# Patient Record
Sex: Male | Born: 1980 | Race: White | Hispanic: No | Marital: Married
Health system: Southern US, Community
[De-identification: ages and names within clinical notes are randomized; demographics above are authoritative.]

---

## 2012-02-08 ENCOUNTER — Ambulatory Visit: Payer: Self-pay | Admitting: Internal Medicine

## 2014-10-25 ENCOUNTER — Emergency Department: Payer: Self-pay | Admitting: Emergency Medicine

## 2014-12-07 ENCOUNTER — Ambulatory Visit: Payer: Self-pay | Admitting: Neurology

## 2015-09-17 ENCOUNTER — Other Ambulatory Visit: Payer: Self-pay | Admitting: Gastroenterology

## 2015-09-17 DIAGNOSIS — K5000 Crohn's disease of small intestine without complications: Secondary | ICD-10-CM

## 2015-09-25 ENCOUNTER — Ambulatory Visit
Admission: RE | Admit: 2015-09-25 | Discharge: 2015-09-25 | Disposition: A | Discharge status: Home / Self Care | Payer: BC Managed Care – PPO | Source: Ambulatory Visit | Attending: Gastroenterology | Admitting: Gastroenterology

## 2015-09-25 DIAGNOSIS — K5000 Crohn's disease of small intestine without complications: Secondary | ICD-10-CM | POA: Insufficient documentation

## 2015-09-25 MED ORDER — IOHEXOL 300 MG/ML  SOLN
100.0000 mL | Freq: Once | INTRAMUSCULAR | Status: AC | PRN
  Administered 2015-09-25: 100 mL via INTRAVENOUS

## 2017-08-09 IMAGING — CT CT ABD-PELV W/ CM
2 of 4 series · 15 of 46 positions shown, 17 images · IV contrast (omnipaque)
Comparison: None.

CLINICAL DATA: Followup active Crohn's disease.

EXAM:
CT ABDOMEN AND PELVIS WITH CONTRAST
TECHNIQUE: Multidetector CT imaging of the abdomen and pelvis was performed
using the standard protocol following bolus administration of
intravenous contrast.
CONTRAST:  100mL OMNIPAQUE IOHEXOL 300 MG/ML  SOLN

[Series 2: routine with · axial · 0.68mm/px · z∈[-839,-419]mm · 12 of 93 slices shown, 14 images]
[im 5/93  soft-tissue]
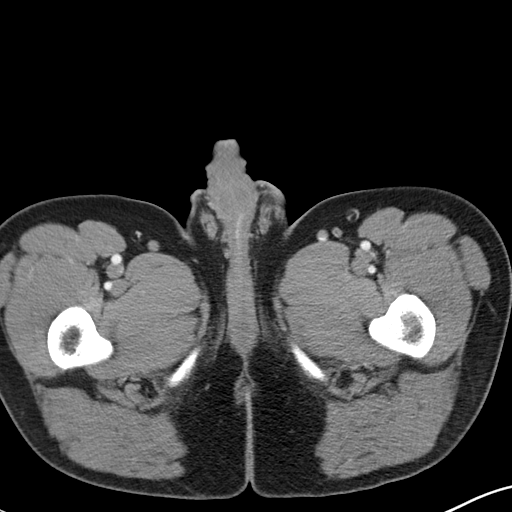
[im 5/93  bone]
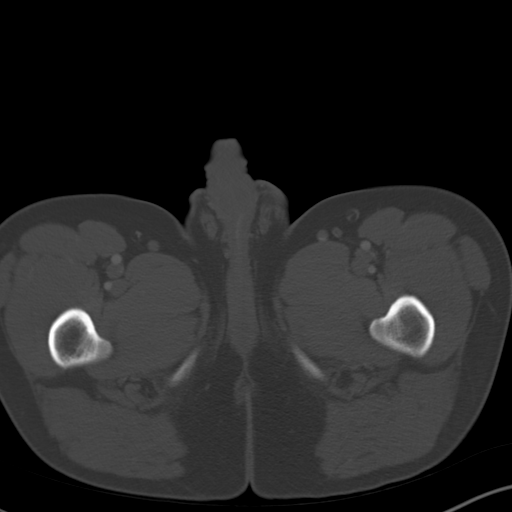
[im 13/93  soft-tissue]
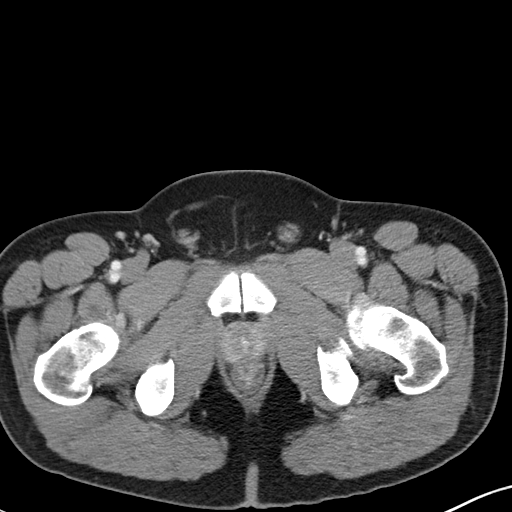
[im 21/93  soft-tissue]
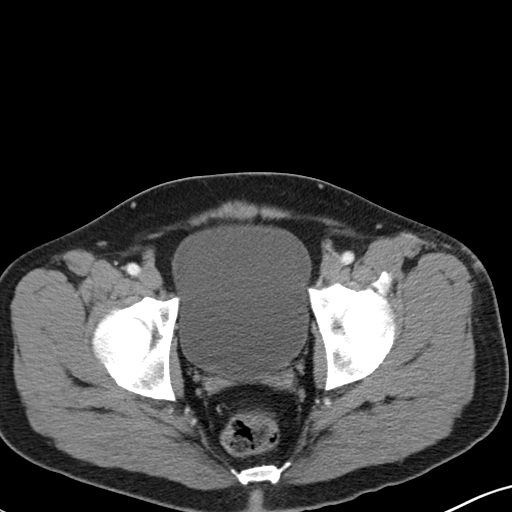
[im 29/93  soft-tissue]
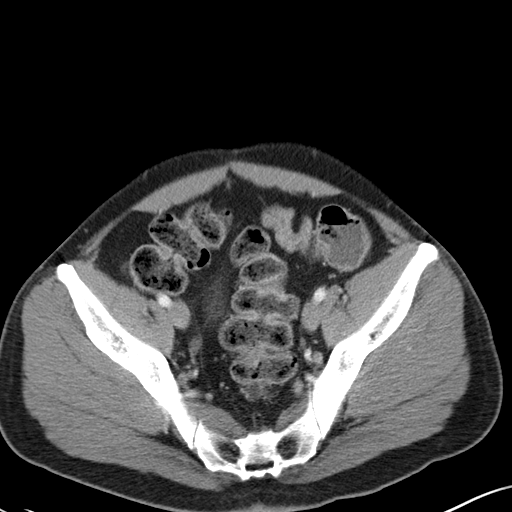
[im 37/93  soft-tissue]
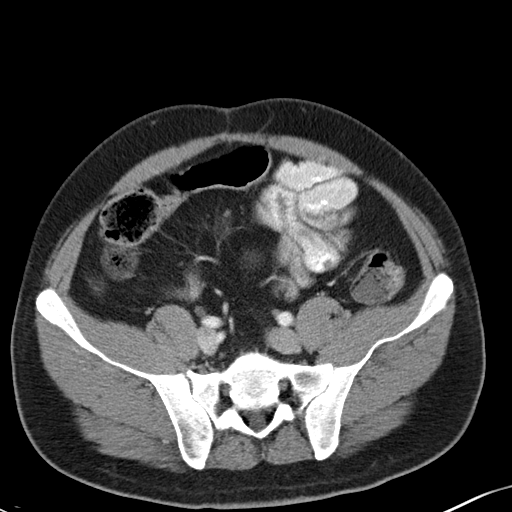
[im 45/93  soft-tissue]
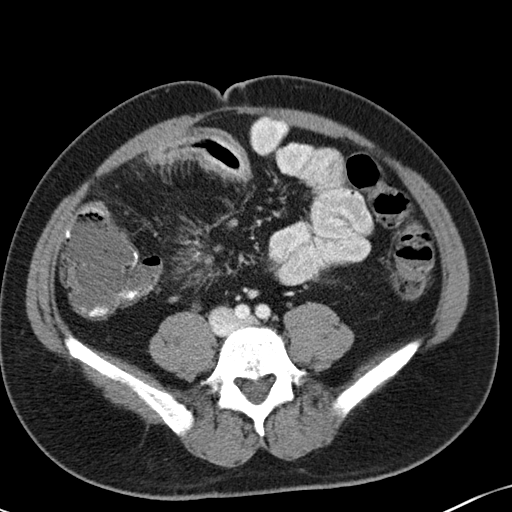
[im 49/93  soft-tissue]
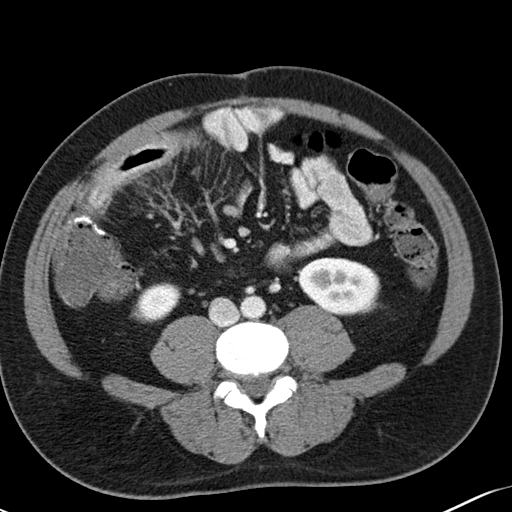
[im 57/93  soft-tissue]
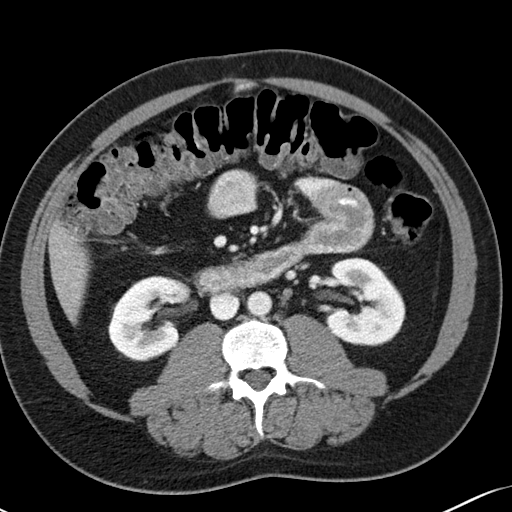
[im 65/93  soft-tissue]
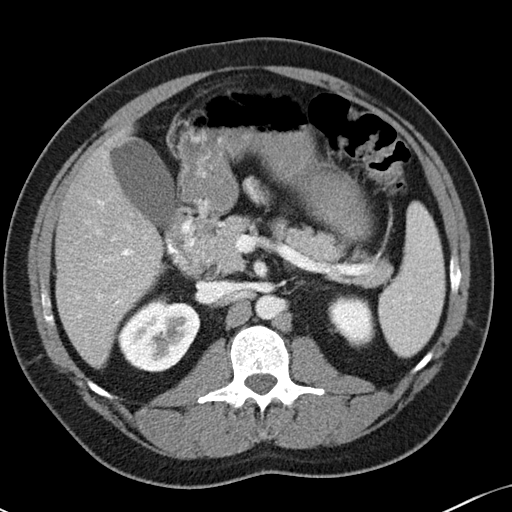
[im 65/93  bone]
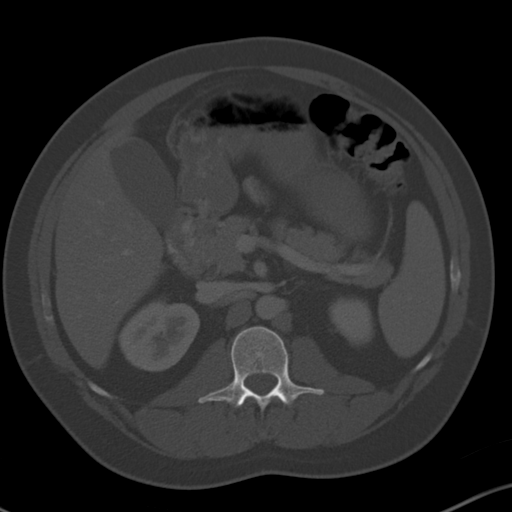
[im 73/93  soft-tissue]
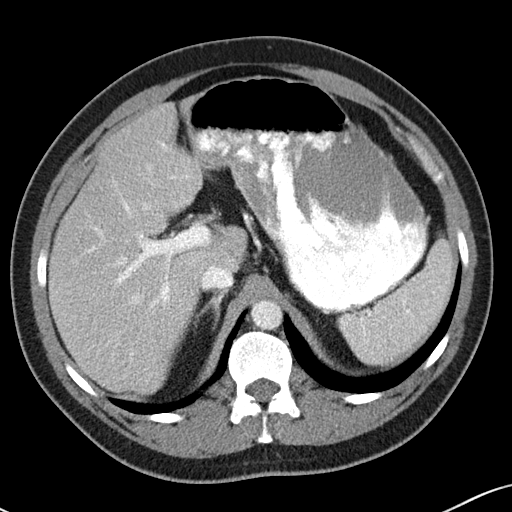
[im 81/93  soft-tissue]
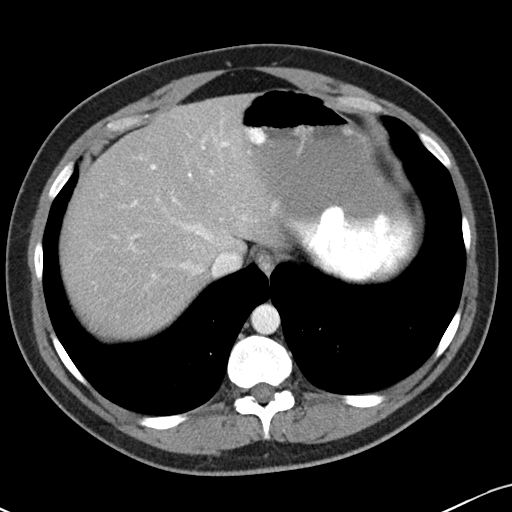
[im 89/93  soft-tissue]
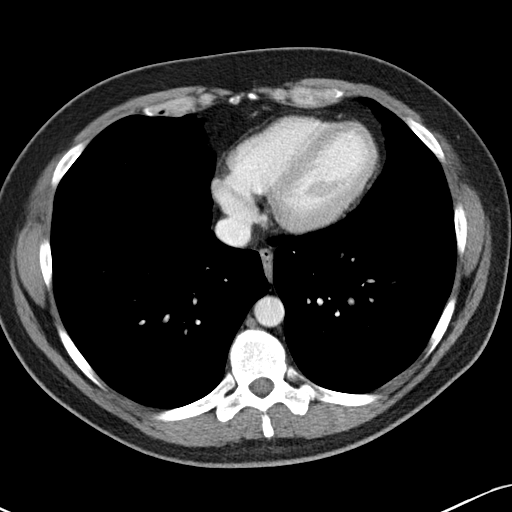

[Series 5: cor routine with · coronal · 0.67mm/px · 3 of 167 slices shown]
[im 56/167  soft-tissue]
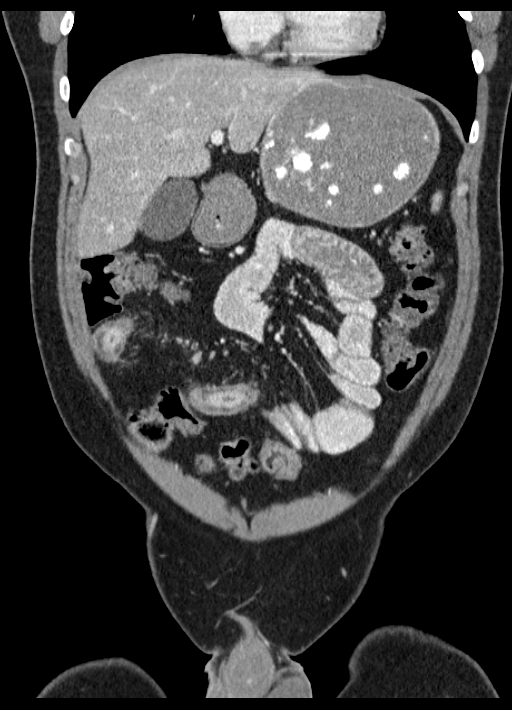
[im 74/167  soft-tissue]
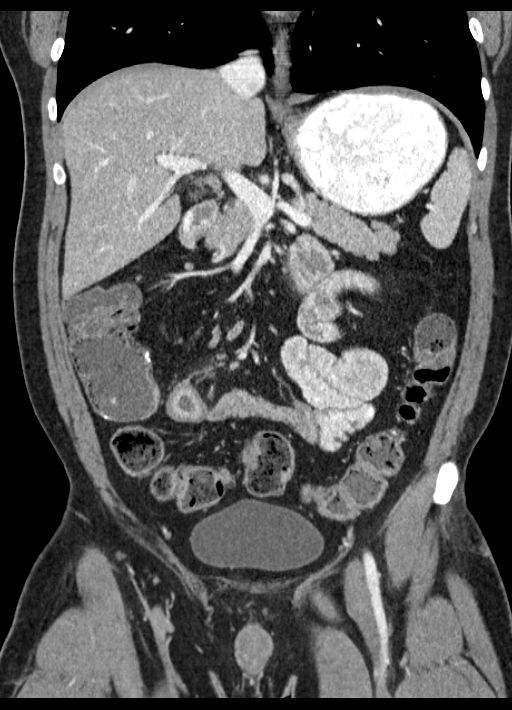
[im 93/167  soft-tissue]
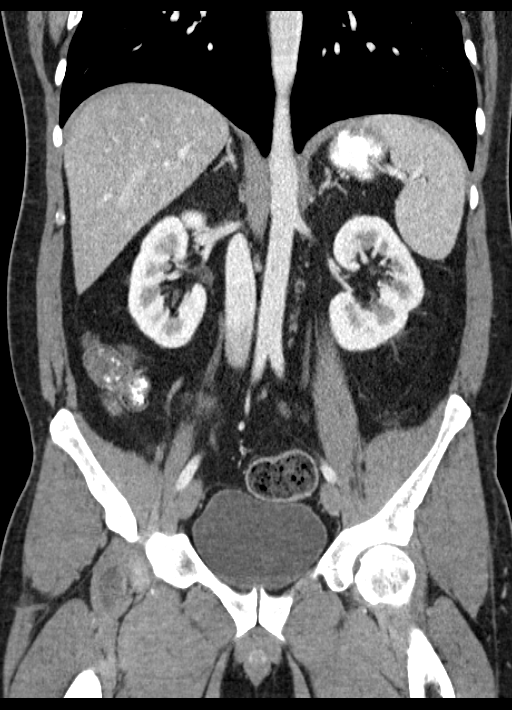

[15 of 46 positions shown; findings below may reference images not displayed]

FINDINGS: Lower chest: The lung bases are clear. No pleural effusion or
pulmonary nodule. The heart is normal in size. No pericardial
effusion. The distal esophagus is normal.

Hepatobiliary: No focal hepatic lesions or intrahepatic biliary
dilatation. The gallbladder is normal. No common bile duct
dilatation.

Pancreas: No mass, inflammation or ductal dilatation.

Spleen: Normal size.  No focal lesions.

Adrenals/Urinary Tract: The adrenal glands and kidneys are normal.
The bladder is normal.

Stomach/Bowel: The stomach, duodenum and proximal small bowel are
unremarkable. There is fairly significant inflammation involving the
distal and neoterminal ileum with marked mucosal enhancement,
submucosal edema and serosal enhancement (target sign). This is
consistent with active Crohn's disease. The cecum and ascending
colon are unremarkable. Surgical changes noted from previous ileal
colonic anastomosis. Prominent mesenteric vasculature and enlarged
mesenteric lymph nodes consistent with inflammation. No mechanical
or functional obstruction is identified. The colon is unremarkable.
Moderate stool in the sigmoid colon and rectum.

Vascular/Lymphatic: Numerous small inflamed mesenteric and
retroperitoneal lymph nodes but no mass or overt adenopathy. The
aorta and branch vessels are patent. The major venous structures are
patent.

Other: The bladder is mildly distended. The prostate gland and
seminal vesicles are normal. No pelvic mass or adenopathy. No free
pelvic fluid collections. No inguinal adenopathy.

Musculoskeletal: No significant bony findings.
IMPRESSION: CT findings consistent with active Crohn's disease with fairly
significant inflammation of the distal and neoterminal ileum. No
complicating features such as abscess, obstruction or fistula.

## 2019-07-10 ENCOUNTER — Other Ambulatory Visit: Payer: Self-pay

## 2019-07-10 DIAGNOSIS — Z20822 Contact with and (suspected) exposure to covid-19: Secondary | ICD-10-CM

## 2019-07-11 ENCOUNTER — Telehealth: Payer: Self-pay | Admitting: *Deleted

## 2019-07-11 LAB — NOVEL CORONAVIRUS, NAA: SARS-CoV-2, NAA: NOT DETECTED

## 2019-07-11 NOTE — Telephone Encounter (Signed)
Reviewed negative [phone] results with patient. Also, sent new MyChart code as text and mailed copy of results as requested. No additional questions.

## 2020-01-19 ENCOUNTER — Ambulatory Visit: Payer: BC Managed Care – PPO | Attending: Internal Medicine

## 2020-01-19 DIAGNOSIS — Z23 Encounter for immunization: Secondary | ICD-10-CM | POA: Insufficient documentation

## 2020-01-19 NOTE — Progress Notes (Signed)
   Covid-19 Vaccination Clinic  Name:  Raymie Giammarco    MRN: [id number] DOB: 12-17-1980  01/19/2020  Mr. Mazon was observed post Covid-19 immunization for 15 minutes without incidence. He was provided with Vaccine Information Sheet and instruction to access the V-Safe system.   Mr. Kirkpatrick was instructed to call 911 with any severe reactions post vaccine: Marland Kitchen Difficulty breathing  . Swelling of your face and throat  . A fast heartbeat  . A bad rash all over your body  . Dizziness and weakness    Immunizations Administered    Name Date Dose VIS Date Route   Pfizer COVID-19 Vaccine 01/19/2020  4:06 PM 0.3 mL 11/02/2019 Intramuscular   Manufacturer: [employer], [employer]   Lot: [id number]   NDC: [address]-7

## 2020-02-09 ENCOUNTER — Ambulatory Visit: Payer: BC Managed Care – PPO | Attending: Internal Medicine

## 2020-02-09 DIAGNOSIS — Z23 Encounter for immunization: Secondary | ICD-10-CM

## 2020-02-09 NOTE — Progress Notes (Signed)
   Covid-19 Vaccination Clinic  Name:  Fuquan Wilson    MRN: [id number] DOB: 1981-03-30  02/09/2020  Mr. Rauf was observed post Covid-19 immunization for 15 minutes without incident. He was provided with Vaccine Information Sheet and instruction to access the V-Safe system.   Mr. Brosh was instructed to call 911 with any severe reactions post vaccine: Marland Kitchen Difficulty breathing  . Swelling of face and throat  . A fast heartbeat  . A bad rash all over body  . Dizziness and weakness   Immunizations Administered    Name Date Dose VIS Date Route   Pfizer COVID-19 Vaccine 02/09/2020  8:10 AM 0.3 mL 11/02/2019 Intramuscular   Manufacturer: [employer], [employer]   Lot: [id number]   NDC: [address]-8

## 2020-07-24 ENCOUNTER — Other Ambulatory Visit: Payer: Self-pay

## 2020-07-24 ENCOUNTER — Ambulatory Visit: Payer: BC Managed Care – PPO

## 2020-09-08 ENCOUNTER — Ambulatory Visit (INDEPENDENT_AMBULATORY_CARE_PROVIDER_SITE_OTHER): Payer: BC Managed Care – PPO | Admitting: *Deleted

## 2020-09-08 ENCOUNTER — Other Ambulatory Visit: Payer: Self-pay

## 2020-09-08 DIAGNOSIS — Z23 Encounter for immunization: Secondary | ICD-10-CM

## 2020-09-08 NOTE — Progress Notes (Signed)
Pt here to get Flu and Tdap. Pt tolerated injections well.

## 2023-08-25 ENCOUNTER — Ambulatory Visit: Payer: BC Managed Care – PPO

## 2023-08-25 DIAGNOSIS — Z719 Counseling, unspecified: Secondary | ICD-10-CM

## 2023-08-25 DIAGNOSIS — Z23 Encounter for immunization: Secondary | ICD-10-CM

## 2023-08-25 NOTE — Progress Notes (Signed)
In nurse clinic with family for flu vaccine which was given and tolerated well. Updated [facility] copy given and explained. Jerel Shepherd, RN

## 2024-03-06 ENCOUNTER — Ambulatory Visit: Payer: Self-pay

## 2024-03-06 DIAGNOSIS — Z23 Encounter for immunization: Secondary | ICD-10-CM

## 2024-03-06 DIAGNOSIS — Z719 Counseling, unspecified: Secondary | ICD-10-CM

## 2024-03-06 NOTE — Progress Notes (Signed)
 In nurse clinic for immunizations, accompanied by partner and child. Patient requesting Covid Comirnaty 12+ vaccine. Voices no concerns. VIS reviewed and given to patient. Vaccine tolerated well; no issues noted. [facility] updated and copy given to patient.  Clare Critchley, RN

## 2025-02-07 ENCOUNTER — Ambulatory Visit: Admitting: Orthopedic Surgery
